# Patient Record
Sex: Female | Born: 1960 | Hispanic: Yes | Marital: Married | Smoking: Never smoker
Health system: Southern US, Community
[De-identification: ages and names within clinical notes are randomized; demographics above are authoritative.]

## PROBLEM LIST (undated history)

## (undated) DIAGNOSIS — M797 Fibromyalgia: Secondary | ICD-10-CM

## (undated) DIAGNOSIS — R5382 Chronic fatigue, unspecified: Secondary | ICD-10-CM

## (undated) DIAGNOSIS — H5213 Myopia, bilateral: Secondary | ICD-10-CM

## (undated) DIAGNOSIS — Q059 Spina bifida, unspecified: Secondary | ICD-10-CM

## (undated) DIAGNOSIS — T8859XA Other complications of anesthesia, initial encounter: Secondary | ICD-10-CM

## (undated) HISTORY — DX: Chronic fatigue, unspecified: R53.82

## (undated) HISTORY — DX: Fibromyalgia: M79.7

## (undated) HISTORY — DX: Spina bifida, unspecified: Q05.9

## (undated) HISTORY — DX: Myopia, bilateral: H52.13

## (undated) HISTORY — PX: TUBAL LIGATION: SHX77

## (undated) HISTORY — DX: Other complications of anesthesia, initial encounter: T88.59XA

---

## 2019-04-12 ENCOUNTER — Telehealth: Payer: Self-pay | Admitting: Pediatric Intensive Care

## 2019-04-12 NOTE — Telephone Encounter (Signed)
Call to client for referral from [place]. Call with [facility] Interpretation [phone]. Client ID [phone]. Client is currently living in [facility] but will be coming to [facility] next week to care for her parents. She and her husband will be living here permanently. Client states that she is post-menapausal but has been having vaginal bleeding- scant amount, no bright red- for the last ten days. She states a history of ovarian cysts. She states some lower abdominal pain on the first day she had the bleeding "like with a period". She has a history of fibromyalgia, environmental allergies and allergic rhinitis. She takes not prescription or OTC medications. Client consents to referral for PCP. She would like an appointment as soon as possible. CN advised client of process for referral and that new patient appointment might take up to 4 weeks. CN will refer to mobile clinic for walk in if necessary. Lisette Abu RN BSN CNP [phone]

## 2019-04-13 ENCOUNTER — Telehealth: Payer: Self-pay

## 2019-04-13 NOTE — Telephone Encounter (Signed)
Appointment scheduled to establish care with Dr Margarita Rana 05/02/2019 @ 1530.  She could be seen at the mobile clinic prior to that if needed.

## 2019-04-18 ENCOUNTER — Telehealth: Payer: Self-pay | Admitting: Pediatric Intensive Care

## 2019-04-18 NOTE — Telephone Encounter (Signed)
Left HIPAA compliant message with [facility] [phone]. Please return call to discuss appointment date/time. Lisette Abu RN BSN CNP [phone]

## 2019-04-19 ENCOUNTER — Telehealth: Payer: Self-pay | Admitting: Pediatric Intensive Care

## 2019-04-19 NOTE — Telephone Encounter (Signed)
Call from client with friend Minette Brine fro interpretation. Clientt states that she has filled out new patient appointment information at [facility] and will make appointment there. CN advised client to call if she needs further assistance. Lisette Abu Rn BSN CNP [phone]

## 2019-05-02 ENCOUNTER — Ambulatory Visit: Payer: Self-pay | Admitting: Family Medicine

## 2019-05-22 ENCOUNTER — Other Ambulatory Visit: Payer: Self-pay

## 2019-05-22 ENCOUNTER — Ambulatory Visit (INDEPENDENT_AMBULATORY_CARE_PROVIDER_SITE_OTHER): Payer: Self-pay | Admitting: Family Medicine

## 2019-05-22 ENCOUNTER — Encounter: Payer: Self-pay | Admitting: Family Medicine

## 2019-05-22 VITALS — BP 118/62 | HR 86 | Ht 60.0 in | Wt 111.8 lb

## 2019-05-22 DIAGNOSIS — R5382 Chronic fatigue, unspecified: Secondary | ICD-10-CM

## 2019-05-22 DIAGNOSIS — Z0000 Encounter for general adult medical examination without abnormal findings: Secondary | ICD-10-CM

## 2019-05-22 DIAGNOSIS — Z8742 Personal history of other diseases of the female genital tract: Secondary | ICD-10-CM

## 2019-05-22 DIAGNOSIS — G9332 Myalgic encephalomyelitis/chronic fatigue syndrome: Secondary | ICD-10-CM

## 2019-05-22 DIAGNOSIS — M797 Fibromyalgia: Secondary | ICD-10-CM

## 2019-05-22 DIAGNOSIS — E785 Hyperlipidemia, unspecified: Secondary | ICD-10-CM

## 2019-05-22 NOTE — Progress Notes (Signed)
    SUBJECTIVE:   CHIEF COMPLAINT / HPI:   Mrs. Merklinger is a 59 yo F that presents as a new patient with the concerns below. She has been in the [place] for 2 months. She has traveled here with her husband from [place]. She was quarantined in [place] for several months prior. She has her health records from [place] and will bring them to her next visit.   Hx of abnormal vaginal bleeding 03/28/19 had 6 days of intermittent bleeding where she required the use of pads and tampons. Has not occurred since. Has happened in the past and endometrium was biopsied. Patient reports this biopsy was normal. Denies current vaginal bleeding, discharge, or pelvic pain.  Hx of chronic fatigue/fibromyalgia Endorses multiple pains as well as headaches all the time. In [place] was  diagnosed with fibromyalgia. She is not currently taking any medication for this. She denies changes in vision.  Health maintenance Patient report normal pap in 2019 and normal mammogram in 2021. Has high cholesterol but does not take medication for this.   PERTINENT  PMH / PSH: Seasonal allergies, C/S 1995, Tubal ligation 1998  OBJECTIVE:   BP 118/62   Pulse 86   Ht 5' (1.524 m)   Wt 111 lb 12.8 oz (50.7 kg)   SpO2 99%   BMI 21.83 kg/m    General: Appears well, no acute distress. Age appropriate. Cardiac: RRR, normal heart sounds, no murmurs Respiratory: CTAB, normal effort Abdomen: soft, nontender, nondistended, +bowel sounds Extremities: No edema or cyanosis.  ASSESSMENT/PLAN:   Hx of metrorrhagia -Hgb 13.5 -TSH wnl -Transvaginal US scheduled for 5/24 @2pm  -Follow up with records -Consider GYN referral or colpo clinic at follow up visit in 1 week  Chronic fatigue syndrome with fibromyalgia -Consider gabapentin or duloxetine if patient is amenable -Discuss at follow up visit  Hyperlipidemia -LDL-c 127 -ASCVD 3.6% -Assess for risk factors at follow up   Healthcare maintenance -Bring all records to follow up -CBC,  BMP grossly unremarkable -Consider A1c w/ elevated gluoce on BMP -FHx of Heart Disease, HTN, CVA, and osteoporosis   Gerlene Fee, [place]

## 2019-05-22 NOTE — Patient Instructions (Signed)
It was very nice to meet you today. Please enjoy the rest of your week. Today you were seen to establish care.  Follow up in 1 week for an annual wellness exam or sooner if needed.  Please bring your medical records to your next visit.

## 2019-05-23 LAB — CBC WITH DIFFERENTIAL/PLATELET
Basophils Absolute: 0 10*3/uL (ref 0.0–0.2)
Basos: 0 %
EOS (ABSOLUTE): 0.1 10*3/uL (ref 0.0–0.4)
Eos: 1 %
Hematocrit: 40.6 % (ref 34.0–46.6)
Hemoglobin: 13.5 g/dL (ref 11.1–15.9)
Immature Grans (Abs): 0 10*3/uL (ref 0.0–0.1)
Immature Granulocytes: 0 %
Lymphocytes Absolute: 1.3 10*3/uL (ref 0.7–3.1)
Lymphs: 21 %
MCH: 31.5 pg (ref 26.6–33.0)
MCHC: 33.3 g/dL (ref 31.5–35.7)
MCV: 95 fL (ref 79–97)
Monocytes Absolute: 0.4 10*3/uL (ref 0.1–0.9)
Monocytes: 6 %
Neutrophils Absolute: 4.5 10*3/uL (ref 1.4–7.0)
Neutrophils: 72 %
Platelets: 366 10*3/uL (ref 150–450)
RBC: 4.28 x10E6/uL (ref 3.77–5.28)
RDW: 12.9 % (ref 11.7–15.4)
WBC: 6.2 10*3/uL (ref 3.4–10.8)

## 2019-05-23 LAB — BASIC METABOLIC PANEL
BUN/Creatinine Ratio: 16 (ref 12–28)
BUN: 13 mg/dL (ref 8–27)
CO2: 22 mmol/L (ref 20–29)
Calcium: 9.9 mg/dL (ref 8.7–10.3)
Chloride: 103 mmol/L (ref 96–106)
Creatinine, Ser: 0.83 mg/dL (ref 0.57–1.00)
GFR calc Af Amer: 88 mL/min/{1.73_m2} (ref >59)
GFR calc non Af Amer: 76 mL/min/{1.73_m2} (ref >59)
Glucose: 112 mg/dL — ABNORMAL HIGH (ref 65–99)
Potassium: 4.2 mmol/L (ref 3.5–5.2)
Sodium: 141 mmol/L (ref 134–144)

## 2019-05-23 LAB — TSH: TSH: 2.09 u[IU]/mL (ref 0.450–4.500)

## 2019-05-23 LAB — LIPID PANEL
Chol/HDL Ratio: 4.6 ratio — ABNORMAL HIGH (ref 0.0–4.4)
Cholesterol, Total: 214 mg/dL — ABNORMAL HIGH (ref 100–199)
HDL: 47 mg/dL (ref >39)
LDL Chol Calc (NIH): 132 mg/dL — ABNORMAL HIGH (ref 0–99)
Triglycerides: 197 mg/dL — ABNORMAL HIGH (ref 0–149)
VLDL Cholesterol Cal: 35 mg/dL (ref 5–40)

## 2019-05-26 ENCOUNTER — Encounter: Payer: Self-pay | Admitting: Family Medicine

## 2019-05-26 DIAGNOSIS — G9332 Myalgic encephalomyelitis/chronic fatigue syndrome: Secondary | ICD-10-CM | POA: Insufficient documentation

## 2019-05-26 DIAGNOSIS — E785 Hyperlipidemia, unspecified: Secondary | ICD-10-CM | POA: Insufficient documentation

## 2019-05-26 DIAGNOSIS — J302 Other seasonal allergic rhinitis: Secondary | ICD-10-CM | POA: Insufficient documentation

## 2019-05-26 DIAGNOSIS — Z0000 Encounter for general adult medical examination without abnormal findings: Secondary | ICD-10-CM | POA: Insufficient documentation

## 2019-05-26 DIAGNOSIS — N950 Postmenopausal bleeding: Secondary | ICD-10-CM | POA: Insufficient documentation

## 2019-05-26 NOTE — Assessment & Plan Note (Addendum)
-  Hgb 13.5 -TSH wnl -Transvaginal US scheduled for 5/24 @2pm  -Follow up with records -Consider GYN referral or colpo clinic at follow up visit in 1 week

## 2019-05-26 NOTE — Assessment & Plan Note (Addendum)
-  LDL-c 127 -ASCVD 3.6% -Assess for risk factors at follow up

## 2019-05-26 NOTE — Assessment & Plan Note (Signed)
-  Consider gabapentin or duloxetine if patient is amenable -Discuss at follow up visit

## 2019-05-26 NOTE — Assessment & Plan Note (Signed)
-  Bring all records to follow up -CBC, BMP grossly unremarkable -Consider A1c w/ elevated gluoce on BMP -FHx of Heart Disease, HTN, CVA, and osteoporosis

## 2019-05-29 ENCOUNTER — Other Ambulatory Visit: Payer: Self-pay

## 2019-05-29 ENCOUNTER — Ambulatory Visit
Admission: RE | Admit: 2019-05-29 | Discharge: 2019-05-29 | Disposition: A | Discharge status: Home / Self Care | Payer: Self-pay | Source: Ambulatory Visit | Attending: Family Medicine | Admitting: Family Medicine

## 2019-05-29 DIAGNOSIS — Z8742 Personal history of other diseases of the female genital tract: Secondary | ICD-10-CM | POA: Insufficient documentation

## 2019-05-31 ENCOUNTER — Encounter: Payer: Self-pay | Admitting: Family Medicine

## 2019-06-06 ENCOUNTER — Other Ambulatory Visit (HOSPITAL_COMMUNITY): Payer: Self-pay | Admitting: Family Medicine

## 2019-06-06 DIAGNOSIS — Z8742 Personal history of other diseases of the female genital tract: Secondary | ICD-10-CM

## 2019-06-06 DIAGNOSIS — N838 Other noninflammatory disorders of ovary, fallopian tube and broad ligament: Secondary | ICD-10-CM

## 2019-06-07 ENCOUNTER — Telehealth (HOSPITAL_COMMUNITY): Payer: Self-pay | Admitting: Family Medicine

## 2019-06-07 NOTE — Telephone Encounter (Signed)
Attempted to call pt with [facility]. Joy Woods [id number] could not LVM d/t number on file is disconnected.

## 2019-06-07 NOTE — Telephone Encounter (Signed)
-----   Message from Junious Dresser, [place] sent at 06/06/2019  2:45 PM EDT ----- Did you call the pt? ----- Message ----- From: Gerlene Fee, DO Sent: 06/06/2019   1:42 PM EDT To: [facility]  Patient has a uterine fibroid in the upper segment of uterus. This could be the source of abnormal bleeding. There is a simple cyst on the left ovary this is likely benign. Patient also has a complex heterogeneous collection at right ovary of uncertainty. Will refer to gynecology and discuss further at follow up appointment with 06/12/2019.

## 2019-06-12 ENCOUNTER — Other Ambulatory Visit: Payer: Self-pay

## 2019-06-12 ENCOUNTER — Encounter: Payer: Self-pay | Admitting: Family Medicine

## 2019-06-12 ENCOUNTER — Ambulatory Visit (INDEPENDENT_AMBULATORY_CARE_PROVIDER_SITE_OTHER): Payer: Self-pay | Admitting: Family Medicine

## 2019-06-12 VITALS — BP 112/62 | HR 84 | Wt 113.0 lb

## 2019-06-12 DIAGNOSIS — N950 Postmenopausal bleeding: Secondary | ICD-10-CM

## 2019-06-12 DIAGNOSIS — R9389 Abnormal findings on diagnostic imaging of other specified body structures: Secondary | ICD-10-CM

## 2019-06-12 DIAGNOSIS — E785 Hyperlipidemia, unspecified: Secondary | ICD-10-CM

## 2019-06-12 DIAGNOSIS — Z0000 Encounter for general adult medical examination without abnormal findings: Secondary | ICD-10-CM

## 2019-06-12 NOTE — Patient Instructions (Signed)
It was very nice to meet you today.   Please got to [facility] for your MRI.  You will be called to schedule your gynecology appointment.   Please call the clinic at [phone] if your symptoms worsen or you have any concerns. It was our pleasure to serve you.

## 2019-06-12 NOTE — Progress Notes (Signed)
    SUBJECTIVE:   CHIEF COMPLAINT / HPI:   Joy Woods is a 59 year old female who presents with her husband to discuss her concerns below.  Ultrasound results Last pelvic imaging according to patient was 11/2018 and was normal according to the patient. She is aware of the cyst on her left ovary. Voiced understanding of being referred to gynecology for uncertain right ovary finding. Is not experiencing bleeding currently and none since March 2021. Has occasional abdominal discomfort but believes this is abdominal pain.  Office visit 05/22/2019 03/28/19 had 6 days of intermittent bleeding where she required the use of pads and tampons. Has not occurred since. Has happened in the past and endometrium was biopsied. Patient reports this biopsy was normal. Denies current vaginal bleeding, discharge, or pelvic pain.  Financial assistance Required financial assistance. Over $2000 in debt from medical bills since being in the [place]. She has been in the [place] for 3 months. Called gynecology office and was instructed to pay before she is seen. Would like to speak personally with Joy Woods about her options.   PERTINENT  PMH / PSH: Hx of postmenopausal bleeding, HLD, fibromyalgia  OBJECTIVE:   BP 112/62   Pulse 84   Wt 113 lb (51.3 kg)   SpO2 98%   BMI 22.07 kg/m   General: Appears well, no acute distress. Age appropriate. Husband present.  Respiratory: Speaking in full sentences. Normal effort  US Pelvis Transvaginal 05/29/2019 Partially calcified leiomyoma at upper uterine segment 2.9 cm greatest size. Obscured endometrial complex. Large simple cyst LEFT ovary 4.8 cm diameter. Complex heterogeneous hypoechoic collection in the RIGHT adnexa, uncertain if represents a mass or potentially segment of complicated hydrosalpinx. Consider further evaluation by MR imaging to characterize.  ASSESSMENT/PLAN:   Postmenopausal bleeding Not currently bleeding or having any other symptoms. Hgb  stable. -Pelvic MRI scheduled for 06/20/2019 @8pm  -Referral to Gynecology -Financial assistance discussed with Joy Woods during this visit   Hyperlipidemia -Follow up in 6 month to repeat fasting  Healthcare maintenance Prior records to be copied today. -Follow up in 6 months or sooner if needed   Gerlene Fee, [place] present during entire encounter Ranell Patrick [phone] * >30 minutes spent discussing results and medical assistance with patient and husband.

## 2019-06-12 NOTE — Assessment & Plan Note (Signed)
Prior records to be copied today. -Follow up in 6 months or sooner if needed

## 2019-06-12 NOTE — Assessment & Plan Note (Addendum)
Not currently bleeding or having any other symptoms. Hgb stable. -Pelvic MRI scheduled for 06/20/2019 @8pm  -Referral to Gynecology -Financial assistance discussed with Kennyth Lose during this visit

## 2019-06-12 NOTE — Assessment & Plan Note (Signed)
-  Follow up in 6 month to repeat fasting

## 2019-06-20 ENCOUNTER — Ambulatory Visit (HOSPITAL_COMMUNITY)
Admission: RE | Admit: 2019-06-20 | Discharge: 2019-06-20 | Disposition: A | Discharge status: Home / Self Care | Payer: Self-pay | Source: Ambulatory Visit | Attending: Family Medicine | Admitting: Family Medicine

## 2019-06-20 DIAGNOSIS — R9389 Abnormal findings on diagnostic imaging of other specified body structures: Secondary | ICD-10-CM | POA: Insufficient documentation

## 2019-06-20 DIAGNOSIS — N950 Postmenopausal bleeding: Secondary | ICD-10-CM | POA: Insufficient documentation

## 2019-06-20 MED ORDER — GADOBUTROL 1 MMOL/ML IV SOLN
6.0000 mL | Freq: Once | INTRAVENOUS | Status: AC | PRN
  Administered 2019-06-20: 6 mL via INTRAVENOUS

## 2019-06-23 ENCOUNTER — Telehealth: Payer: Self-pay | Admitting: Family Medicine

## 2019-06-23 NOTE — Telephone Encounter (Signed)
Attempted to call patient @ [phone] with [facility] about MRI results. No answer and no option to leave a voicemail. Will attempt to reach patient by phone Monday. After than I will send a letter of results.   Dr. Janus Molder

## 2019-06-26 ENCOUNTER — Encounter: Payer: Self-pay | Admitting: Family Medicine

## 2019-06-27 ENCOUNTER — Other Ambulatory Visit: Payer: Self-pay | Admitting: Family Medicine

## 2019-06-27 NOTE — Addendum Note (Signed)
Addended by: Caralee Ates on: 06/27/2019 08:26 AM   Modules accepted: Orders

## 2019-06-27 NOTE — Progress Notes (Signed)
Reflex order placed in prior encounter for CA 125

## 2019-08-14 ENCOUNTER — Telehealth: Payer: Self-pay | Admitting: Family Medicine

## 2019-08-15 NOTE — Telephone Encounter (Signed)
Attempted to call patient at [phone] using [place] interpreter. Her gyn appt is tomorrow 08/16/2019. She will see me 08/28/2019 @3 :10PM.  Neli Fofana Autry-Lott, DO 08/15/2019, 5:44 PM PGY-2, [place]

## 2019-08-16 ENCOUNTER — Ambulatory Visit (INDEPENDENT_AMBULATORY_CARE_PROVIDER_SITE_OTHER): Payer: Self-pay | Admitting: Obstetrics & Gynecology

## 2019-08-16 ENCOUNTER — Other Ambulatory Visit: Payer: Self-pay

## 2019-08-16 ENCOUNTER — Encounter: Payer: Self-pay | Admitting: Obstetrics & Gynecology

## 2019-08-16 VITALS — BP 117/66 | HR 79 | Ht 60.0 in | Wt 113.2 lb

## 2019-08-16 DIAGNOSIS — D250 Submucous leiomyoma of uterus: Secondary | ICD-10-CM

## 2019-08-16 DIAGNOSIS — N950 Postmenopausal bleeding: Secondary | ICD-10-CM

## 2019-08-16 DIAGNOSIS — N83209 Unspecified ovarian cyst, unspecified side: Secondary | ICD-10-CM

## 2019-08-16 NOTE — Progress Notes (Signed)
Patient ID: Joy Woods, female   DOB: 17-Jun-1960, 59 y.o.   MRN: [id number]  Chief Complaint  Patient presents with   Gynecologic Exam    HPI Leahann Climmie Cronce is a 59 y.o. female.  G2P0 No LMP recorded. Patient is postmenopausal. She had an episode of vaginal bleeding in March that lasted for less than a week, no bleeding since then. A similar episode 3 years ago in [place] was evaluated and endometrial biopsy was negative. I reviewed the [place] and MRI report and images today.  HPI   Past Medical History:  Diagnosis Date   Chronic fatigue    Complication of anesthesia    Fibromyalgia    Myopia of both eyes    Spina bifida ([place])     Past Surgical History:  Procedure Laterality Date   CESAREAN SECTION     TUBAL LIGATION      Family History  Problem Relation Age of Onset   Heart disease Mother    High blood pressure Mother    High blood pressure Father    Osteoporosis Father    Stroke Father    Heart disease Brother     Social History Social History   Tobacco Use   Smoking status: Never Smoker   Smokeless tobacco: Never Used  Substance Use Topics   Alcohol use: Not on file   Drug use: Not on file    Allergies  Allergen Reactions   Ibuprofen     Makes headache worse    Current Outpatient Medications  Medication Sig Dispense Refill   ibuprofen (ADVIL) 100 MG/5ML suspension Take 200 mg by mouth every 4 (four) hours as needed.     No current facility-administered medications for this visit.    Review of Systems Review of Systems  Constitutional: Negative.   Gastrointestinal: Positive for abdominal pain (occasional ). Negative for anal bleeding, constipation and diarrhea.  Genitourinary: Negative for pelvic pain, vaginal bleeding and vaginal discharge.    Blood pressure 117/66, pulse 79, height 5' (1.524 m), weight 113 lb 3.2 oz (51.3 kg).  Physical Exam Physical Exam Vitals and nursing note reviewed.   Constitutional:      Appearance: Normal appearance. She is not ill-appearing.  HENT:     Head: Normocephalic.  Cardiovascular:     Rate and Rhythm: Normal rate.  Pulmonary:     Effort: Pulmonary effort is normal.  Abdominal:     General: There is no distension.  Musculoskeletal:        General: Normal range of motion.  Neurological:     Mental Status: She is alert.  Psychiatric:        Mood and Affect: Mood normal.        Behavior: Behavior normal.     Data Reviewed Narrative & Impression  CLINICAL DATA:  Abnormal uterine bleeding, postmenopausal bleeding, history of Caesarean section and tubal ligation  EXAM: ULTRASOUND PELVIS TRANSVAGINAL  TECHNIQUE: Transvaginal ultrasound examination of the pelvis was performed including evaluation of the uterus, ovaries, adnexal regions, and pelvic cul-de-sac.  COMPARISON:  None  FINDINGS: Uterus  Measurements: 5.8 x 3.3 x 4.2 cm = volume: 41 mL. Heterogeneous myometrium. Anteverted. Partially calcified leiomyoma at posterior upper uterus 2.3 x 2.0 x 2.9 cm. This extends submucosal and displaces endometrium anteriorly.  Endometrium  Obscured secondary to leiomyoma at posterior upper uterus, unable to adequately delineated for measurement  Right ovary  Measurements: 2.4 x 1.2 x 1.4 cm = volume: 2.2 mL. Normal morphology without  mass  Left ovary  Measurements: 5.9 x 3.3 x 1.9 cm = volume: 19.7 mL. Simple cyst LEFT ovary 4.8 x 3.2 x 3.7 cm.  Other findings: Complicated hypoechoic collection identified in the RIGHT adnexa, 5.8 x 2.5 x 1.7 cm, question complicated hydrosalpinx versus irregular cystic mass. No free pelvic fluid.  IMPRESSION: Partially calcified leiomyoma at upper uterine segment 2.9 cm greatest size.  Obscured endometrial complex.  Large simple cyst LEFT ovary 4.8 cm diameter.  Complex heterogeneous hypoechoic collection in the RIGHT adnexa, uncertain if represents a mass or  potentially segment of complicated hydrosalpinx.  Consider further evaluation by MR imaging to characterize.   Electronically Signed   By: Lavonia Dana M.D.   On: 05/29/2019 17:08   CLINICAL DATA:  Right adnexal mass and left ovarian cyst on recent ultrasound. Postmenopausal female.  EXAM: MRI PELVIS WITHOUT AND WITH CONTRAST  TECHNIQUE: Multiplanar multisequence MR imaging of the pelvis was performed both before and after administration of intravenous contrast.  CONTRAST:  99mL GADAVIST GADOBUTROL 1 MMOL/ML IV SOLN  COMPARISON:  Ultrasound on 05/29/2019  FINDINGS: Lower Urinary Tract: No urinary bladder or urethral abnormality identified.  Bowel: Sigmoid diverticulosis is demonstrated, however there is no evidence of diverticulitis.  Vascular/Lymphatic: Unremarkable. No pathologically enlarged pelvic lymph nodes identified.  Reproductive:  -- Uterus: Measures 5.7 x 3.3 x 4.6 cm. A central submucosal fibroid is seen which measures 2.5 cm in maximum diameter. This obscures the endometrium, however there is no evidence of abnormal endometrial thickening. Cervix and vagina are unremarkable.  -- Right ovary: Appears normal. A small cystic tubular structure is seen adjacent to the right ovary which measures 3.2 x 0.9 cm, consistent with a mild hydrosalpinx.  -- Left ovary: A simple cyst with benign characteristics is seen measuring 4.9 x 3.7 cm. This has benign characteristics.  Other: No peritoneal thickening or abnormal free fluid.  Musculoskeletal:  Unremarkable.  IMPRESSION: 1. Mild right hydrosalpinx.  No evidence of right ovarian mass. 2. 4.9 cm benign-appearing left ovarian cyst. Suggest correlation with tumor markers, and recommend continued follow-up by ultrasound in 6-12 months. 3. 2.5 cm central submucosal fibroid. No evidence of abnormal endometrial thickening. 4. Sigmoid diverticulosis, without evidence of  diverticulitis.   Electronically Signed   By: Marlaine Hind M.D.   On: 06/21/2019 10:29    Assessment Postmenopausal bleeding  Submucous leiomyoma of uterus - Plan: CA 125  Cyst of ovary, unspecified laterality - Plan: US PELVIC COMPLETE WITH TRANSVAGINAL    Plan Repeat imaging in 4 months for a 6 month interval for the cyst.She has had a benign endometrial biopsy. We discussed this and I feel a repeat can be deferred and she agreed to this although scheduling a biopsy was offered. CA 125 will be done today although the cyst had benign features and could resolve    Emeterio Reeve 08/16/2019, 2:36 PM

## 2019-08-16 NOTE — Patient Instructions (Signed)
Quiste ovrico Ovarian Cyst Un quiste ovrico es una bolsa llena de lquido que se forma en el ovario. Los ovarios son rganos de las mujeres que producen vulos. La mayora de los quistes ovricos desaparecen por s solos y no son cancerosos (son benignos). Otros quistes necesitan tratamiento. Siga estas indicaciones en su casa:  Tome los medicamentos de venta libre y los recetados solamente como se lo haya indicado el mdico.  No conduzca ni use maquinaria pesada mientras toma analgsicos recetados.  Realcese exmenes plvicos y pruebas de Papanicolaou con la frecuencia que le haya indicado el mdico.  Retome sus actividades habituales como se lo haya indicado el mdico. Pregntele al mdico qu actividades son seguras para usted.  No consuma ningn producto que contenga nicotina o tabaco, como cigarrillos y Psychologist, sport and exercise. Si necesita ayuda para dejar de fumar, consulte al mdico.  Concurra a todas las visitas de control como se lo haya indicado el mdico. Esto es importante. Comunquese con un mdico si:  Los perodos menstruales: ? Se retrasan. ? Son irregulares. ? Son dolorosos.   Sus perodos cesan.  Tiene dolor plvico que no desaparece.  Siente presin en la vejiga.  Tiene dificultad para vaciar la vejiga cuando orina.  Siente dolor durante las [employer].  Le aparece alguno de los siguientes sntomas en el abdomen: ? Sensacin de Environmental health practitioner lleno. ? Presin. ? Molestias. ? Dolor que persiste. ? Hinchazn.  Se siente mal constantemente.  Tiene dificultades para defecar (estreimiento).  No tiene el apetito habitual (pierde el apetito).  Tiene un acn muy grave.  Nota un aumento del vello corporal y facial.  Aumenta o disminuye de peso sin hacer modificaciones en su actividad fsica y en su dieta habitual.  Cree que puede estar [place]. Solicite ayuda de inmediato si:  Siente en el abdomen un dolor muy intenso o que  [place].  No puede comer ni beber sin vomitar.  Sbitamente tiene fiebre.  Los perodos menstruales son ms abundantes de lo habitual. Esta informacin no tiene Marine scientist el consejo del mdico. Asegrese de hacerle al mdico cualquier pregunta que tenga. Document Revised: 03/28/2016 Document Reviewed: 05/26/2015 Elsevier Patient Education  2020 [employer].

## 2019-08-17 LAB — CA 125: Cancer Antigen (CA) 125: 11.1 U/mL (ref 0.0–38.1)

## 2019-08-28 ENCOUNTER — Encounter: Payer: Self-pay | Admitting: Family Medicine

## 2019-08-28 ENCOUNTER — Other Ambulatory Visit: Payer: Self-pay

## 2019-08-28 ENCOUNTER — Ambulatory Visit (INDEPENDENT_AMBULATORY_CARE_PROVIDER_SITE_OTHER): Payer: Self-pay | Admitting: Family Medicine

## 2019-08-28 VITALS — BP 92/64 | HR 86 | Ht 60.0 in | Wt 113.4 lb

## 2019-08-28 DIAGNOSIS — G8929 Other chronic pain: Secondary | ICD-10-CM

## 2019-08-28 DIAGNOSIS — Z0000 Encounter for general adult medical examination without abnormal findings: Secondary | ICD-10-CM

## 2019-08-28 DIAGNOSIS — M25561 Pain in right knee: Secondary | ICD-10-CM

## 2019-08-28 DIAGNOSIS — Z598 Other problems related to housing and economic circumstances: Secondary | ICD-10-CM

## 2019-08-28 DIAGNOSIS — Z5989 Other problems related to housing and economic circumstances: Secondary | ICD-10-CM

## 2019-08-28 DIAGNOSIS — N950 Postmenopausal bleeding: Secondary | ICD-10-CM

## 2019-08-28 NOTE — Progress Notes (Signed)
SUBJECTIVE:   CHIEF COMPLAINT / HPI:   Right knee pain Experiencing right knee discomfort for 1-2 years intermittently. It is localized above and around the knee. Walking makes it feel better. She noticed increased pain with holding her grandchild a lot. She has not tried any medication for this as she does not prefer to put "chemicals" in her body. She has tried exercise in the past from her last PCP in [place] but this did not help. She tried water aerobics but could not continue due to skin irritation from chlorine. She also joined a fibromyalgia group and did physical therapy with some improvement.   Healthcare maintenance HLD-Last Lipid panel 05/22/2019 Pap- (2019 or 2020) Mammogram-(02/2019) Colonoscopy (states she did a blood test) Tdap (declined at this time) COVID vaccine (declined at this time)  Lack of health insurance Moved from [place] this year and has accumulated several medical bills. Attempted to get assistance with applying for orange card/insurance at our last visit in May. They spoke with Leory Plowman but have not been able to get in touch with her.   PERTINENT  PMH / PSH: Hx of Post Menopausal Bleeding (Gyn appt. 08/16/19), Hx of fibromyalgia (prefers no medications)  OBJECTIVE:   BP 92/64   Pulse 86   Ht 5' (1.524 m)   Wt 113 lb 6.4 oz (51.4 kg)   BMI 22.15 kg/m   General: Appears well, no acute distress. Age appropriate. Cardiac: RRR, normal heart sounds, no murmurs Respiratory: CTAB, normal effort Extremities: Right knee is w/o bruising, edema, or tenderness to palpation. Full ROM in flexion and extension. Mild crepitus w/ flexion. Poor vastus muscle tone. Negative varus/valgus stress. Negative ant/post drawer test.   MRI PELVIS WITHOUT AND WITH CONTRAST  TECHNIQUE: Multiplanar multisequence MR imaging of the pelvis was performed both before and after administration of intravenous contrast.  CONTRAST:  30mL GADAVIST GADOBUTROL 1 MMOL/ML IV  SOLN  COMPARISON:  Ultrasound on 05/29/2019  FINDINGS: Lower Urinary Tract: No urinary bladder or urethral abnormality identified.  Bowel: Sigmoid diverticulosis is demonstrated, however there is no evidence of diverticulitis.  Vascular/Lymphatic: Unremarkable. No pathologically enlarged pelvic lymph nodes identified.  Reproductive:  -- Uterus: Measures 5.7 x 3.3 x 4.6 cm. A central submucosal fibroid is seen which measures 2.5 cm in maximum diameter. This obscures the endometrium, however there is no evidence of abnormal endometrial thickening. Cervix and vagina are unremarkable.  -- Right ovary: Appears normal. A small cystic tubular structure is seen adjacent to the right ovary which measures 3.2 x 0.9 cm, consistent with a mild hydrosalpinx.  -- Left ovary: A simple cyst with benign characteristics is seen measuring 4.9 x 3.7 cm. This has benign characteristics.  Other: No peritoneal thickening or abnormal free fluid.  Musculoskeletal:  Unremarkable.  IMPRESSION: 1. Mild right hydrosalpinx.  No evidence of right ovarian mass. 2. 4.9 cm benign-appearing left ovarian cyst. Suggest correlation with tumor markers, and recommend continued follow-up by ultrasound in 6-12 months. 3. 2.5 cm central submucosal fibroid. No evidence of abnormal endometrial thickening. 4. Sigmoid diverticulosis, without evidence of diverticulitis.   Electronically Signed   By: Marlaine Hind M.D.   On: 06/21/2019 10:29  Results for Joy Woods, Joy Woods DE (MRN [id number]) as of 08/29/2019 19:19   Ref. Range 08/16/2019 14:41  Cancer Antigen (CA) 125 Latest Ref Range: 0.0 - 38.1 U/mL 11.1   ASSESSMENT/PLAN:   Chronic pain of right knee Chronic for 1-2 years. Intermittent. Hx of fibromyalgia. Worsened with added weight such  as small infant. Improved with movement such as walking. No acute findings on physical exams. No evidence or knowledge of trauma. Likely combination of  degenerative joint changes such as osteoarthritis and weak vastus muscles. -PT referral -At home SLR exercises discussed -Voltaren gel/NSAIDs offered patient declined at this time -Seek fibromyalgia group -Imaging not necessary at this time but could consider in the future -Follow up as needed  Healthcare maintenance -Obtain records from [place] to have interpreted for chart with the help of [place] -ASCVD risk 3.6% no statin medication needed at this time. -Declined COVID vaccine today; patient positive in 05/2019 (spent >10 mins discussing benefits of receiving post infection)  Does not have health insurance -CCM referral for resources and financial assistance with medical bills (patient aware and agreeable)  Postmenopausal bleeding Resolved. Recent  Gyn appt 08/16/19 note personal reviewed by me and discussed with patient. Discussed imaging and blood work above as well as the plan with patient. Begnin ovarian cyst. Normal CA 125.  -Needs repeat pelvic ultrasound in 4 month for a 6 month follow up   [place]

## 2019-08-28 NOTE — Patient Instructions (Signed)
It was very nice to see you today. Please enjoy the rest of your week. Today you were seen for right knee pain for this we will refer you to physical therapy please let us know if you do not hear from them in a week. I have prescribed straight leg raise exercises to strengthen the knee, you can use a weight on the end of your ankle for an extra workout and I would do this several times a day.  Please bring in your records to be copied this week so that we may update your health maintenance tabs.  For the ovarian cysts we will repeat imaging in 4 months.  I have also referred you to our chronic care management team for insurance and financial resources they should give you a call within a week.  If you do not hear from please let us know.  Follow up as needed.   Please call the clinic at [phone] if you have any concerns. It was our pleasure to serve you.   Sndrome de Film/video editor y fibromialgia Myofascial Pain Syndrome and Fibromyalgia El sndrome de dolor miofascial y la fibromialgia son trastornos del Social research officer, government. Este dolor puede sentirse, principalmente, en los msculos.  Sndrome de dolor miofascial: ? Siempre se presenta con puntos sensibles al tacto en el msculo que causan dolor ante la compresin (puntos neurlgicos). El dolor puede aparecer y Armed forces operational officer. ? Generalmente, afecta el cuello, la parte superior de la espalda y las zonas de los hombros. El dolor suele irradiarse a los brazos y [place].  Fibromialgia: ? Cursa con dolores musculares y dolor a la palpacin que aparecen y desaparecen. ? Suele asociarse con la fatiga y los problemas del sueo. ? Cursa con puntos neurlgicos. ? Suele ser de larga duracin (crnica), pero no es potencialmente mortal. La fibromialgia y el sndrome de dolor miofascial no son lo mismo. Sin embargo, suelen presentarse juntos. Si tiene ambas afecciones, pueden intensificarse entre s. Ambas afecciones son frecuentes y pueden causar bastante  dolor y [place]. Ambas afecciones pueden ser difciles de diagnosticar porque tienen sntomas que son frecuentes en muchas otras afecciones. Cules son las causas? Se desconocen las causas exactas de estas afecciones. Qu incrementa el riesgo? Es ms probable que usted sufra esta afeccin si:  Tienen antecedentes familiares de la afeccin.  Tiene ciertos factores desencadenantes, por ejemplo: ? Dolores de [place]. ? Una lesin (traumatismo) u otros factores estresantes fsicos. ? Estar bajo mucho estrs. ? Afecciones mdicas como artrosis, artritis reumatoide o lupus. Cules son los signos o los sntomas? Fibromialgia El sntoma principal de la fibromialgia es el dolor ampliamente distribuido y la sensibilidad al tacto en los msculos. A veces, el dolor se describe como punzante, fulgurante o urente. Es posible que tambin tenga:  Hormigueo o adormecimiento.  Problemas al dormir y Programmer, applications.  Problemas con la atencin y la concentracin (disfuncin cognitiva). Otros sntomas pueden incluir lo siguiente:  Problemas de intestino y vejiga.  Dolores de [place].  Problemas visuales.  Problemas con los aromas y los ruidos.  Depresin o cambios en el estado de nimo.  Perodos menstruales dolorosos (dismenorrea).  Sequedad de la piel o los ojos. Estos sntomas pueden variar con [employer]. Sndrome de [employer] Los sntomas del sndrome de dolor miofascial incluyen lo siguiente:  Bandas musculares tensas y fibrosas.  Sensaciones de incomodidad en las zonas musculares. Estas pueden incluir dolor, calambres, ardor, adormecimiento, hormigueo y debilidad.  Dificultad para mover ciertas partes del  cuerpo libremente (poca amplitud de movimiento). Cmo se diagnostica? Esta afeccin puede diagnosticarse en funcin de los sntomas y los antecedentes mdicos. Adems, se le Sales promotion account executive fsico. En general:  La fibromialgia  se diagnostica cuando la persona tiene Social research officer, government, fatiga y otros sntomas durante ms de tres meses y esos sntomas no se pueden atribuir a Pharmacist, hospital.  El sndrome de dolor miofascial se diagnostica cuando la persona tiene puntos neurlgicos en los msculos que son sensibles al tacto y causan dolor en otra parte del cuerpo (dolor referido). Cmo se trata? El tratamiento para estas afecciones depende del tipo de afeccin que tenga.  Para la fibromialgia: ? Analgsicos, como los antiinflamatorios no esteroideos (AINE). ? Medicamentos para tratar la depresin. ? Medicamentos para tratar las convulsiones. ? Medicamentos que [employer].  Para el sndrome de dolor miofascial: ? Analgsicos, como los antiinflamatorios no esteroideos (AINE). ? Relajacin y elongacin de los msculos. ? Inyecciones en los puntos neurlgicos. ? Tratamientos con ondas de sonido (ultrasonido) para [employer]. El tratamiento de estas afecciones a menudo requiere la participacin de un equipo de mdicos. Estos pueden incluir lo siguiente:  El mdico de Marketing executive.  Un fisioterapeuta.  Mdicos complementarios, como masoterapeutas o acupunturistas.  Un psiquiatra para la terapia cognitivo conductual. Siga estas indicaciones en su casa: Medicamentos  Tome los medicamentos de venta libre y los recetados solamente como se lo haya indicado el mdico.  No conduzca ni use maquinaria pesada mientras toma analgsicos recetados.  Si toma analgsicos recetados, tome medidas para prevenir o tratar el estreimiento. El mdico puede recomendarle que: ? Beba suficiente lquido como para mantener la orina de color amarillo plido. ? Consuma alimentos ricos en fibra, como frutas y verduras frescas, cereales integrales y frijoles. ? Limite el consumo de alimentos ricos en grasa y azcares procesados, como los alimentos fritos o dulces. ? Tome un medicamento recetado o de venta libre para el  estreimiento. Estilo de vida   Haga ejercicio como se lo haya indicado el fisioterapeuta o su mdico.  Practique tcnicas de relajacin para mantener el estrs bajo control. Tal vez deba intentar lo siguiente: ? Biorretroalimentacin. ? Formacin de imgenes visuales. ? Hipnosis. ? Relajacin muscular. ? Yoga. ? Meditacin.  Lleve un estilo de vida saludable. Esto incluye consumir una dieta saludable y dormir lo suficiente.  No consuma ningn producto que contenga nicotina o tabaco, como cigarrillos y Psychologist, sport and exercise. Si necesita ayuda para dejar de fumar, consulte al mdico. Indicaciones generales  Hable con el mdico sobre los tratamientos complementarios, como acupuntura o [place].  Considere la posibilidad de unirse a un grupo de apoyo para personas a quienes les diagnosticaron esta afeccin.  No realice actividades que le generen tensin o sobrecarga muscular. Esto incluye hacer movimientos repetitivos y Lexicographer objetos pesados.  Concurra a todas las visitas de seguimiento como se lo haya indicado el mdico. Esto es importante. Dnde buscar ms informacin  Asociacin Nacional de Fibromialgia (National Fibromyalgia Association): www.fmaware.org  Fundacin contra la Artritis (Arthritis Foundation): www.arthritis.org  Asociacin Estadounidense del Dolor Crnico (American Chronic Pain Association): www.theacpa.org Comunquese con un mdico si:  Aparecen nuevos sntomas.  Los sntomas empeoran o el dolor es intenso.  Los [employer] causan efectos secundarios.  Tiene dificultad para dormir.  La afeccin le causa depresin o ansiedad. Resumen  El sndrome de dolor miofascial y la fibromialgia son trastornos del Social research officer, government.  El sndrome de dolor miofascial se presenta con puntos sensibles al tacto en el msculo que causan  dolor ante la compresin (puntos neurlgicos). La fibromialgia tambin se presenta con dolores musculares y sensibilidad al tacto que  aparecen y desaparecen, pero esta afeccin generalmente se asocia a la fatiga y alteraciones del sueo.  La fibromialgia y el sndrome de dolor miofascial no son lo mismo, pero suelen presentarse juntos, lo cual Visual merchandiser y fatiga que dificultan las actividades cotidianas.  El tratamiento para la fibromialgia incluye tomar medicamentos para Media planner los msculos y medicamentos para el dolor, la depresin o las convulsiones. El tratamiento para el sndrome de dolor miofascial incluye tomar medicamentos para Conservation officer, historic buildings, enfriar y Neurosurgeon los msculos e inyectar medicamentos en los puntos neurlgicos.  Siga las indicaciones del mdico en cuanto a tomar medicamentos y Theatre manager un estilo de vida saludable. Esta informacin no tiene Marine scientist el consejo del mdico. Asegrese de hacerle al mdico cualquier pregunta que tenga. Document Revised: 03/05/2017 Document Reviewed: 03/05/2017 Elsevier Patient Education  [place].

## 2019-08-29 DIAGNOSIS — Z5989 Other problems related to housing and economic circumstances: Secondary | ICD-10-CM | POA: Insufficient documentation

## 2019-08-29 DIAGNOSIS — M25561 Pain in right knee: Secondary | ICD-10-CM | POA: Insufficient documentation

## 2019-08-29 NOTE — Assessment & Plan Note (Signed)
-  CCM referral for resources and financial assistance with medical bills (patient aware and agreeable)

## 2019-08-29 NOTE — Assessment & Plan Note (Signed)
Chronic for 1-2 years. Intermittent. Hx of fibromyalgia. Worsened with added weight such as small infant. Improved with movement such as walking. No acute findings on physical exams. No evidence or knowledge of trauma. Likely combination of degenerative joint changes such as osteoarthritis and weak vastus muscles. -PT referral -At home SLR exercises discussed -Voltaren gel/NSAIDs offered patient declined at this time -Seek fibromyalgia group -Imaging not necessary at this time but could consider in the future -Follow up as needed

## 2019-08-29 NOTE — Assessment & Plan Note (Signed)
Resolved. Recent  Gyn appt 08/16/19 note personal reviewed by me and discussed with patient. Discussed imaging and blood work above as well as the plan with patient. Begnin ovarian cyst. Normal CA 125.  -Needs repeat pelvic ultrasound in 4 month for a 6 month follow up

## 2019-08-29 NOTE — Assessment & Plan Note (Addendum)
-  Obtain records from [place] to have interpreted for chart with the help of [place] -ASCVD risk 3.6% no statin medication needed at this time. -Declined COVID vaccine today; patient positive in 05/2019 (spent >10 mins discussing benefits of receiving post infection)

## 2019-09-06 ENCOUNTER — Telehealth: Payer: Self-pay | Admitting: Family Medicine

## 2019-09-06 NOTE — Telephone Encounter (Signed)
Medical records dropped off for dr at front desk for review. Copies placed in doctor's box.  Creig Hines

## 2019-09-18 ENCOUNTER — Telehealth: Payer: Self-pay | Admitting: Family Medicine

## 2019-09-18 NOTE — Telephone Encounter (Signed)
Joy Woods 09/18/2019 Called pt regarding community resource referral received. Left message for pt to call me back, my info is [phone] please see ref notes for more details.  Joy Woods Care Guide, Embedded Care Coordination [place], Care Management    

## 2019-09-19 ENCOUNTER — Telehealth: Payer: Self-pay | Admitting: Family Medicine

## 2019-09-19 NOTE — Telephone Encounter (Signed)
Joy Woods 09/19/2019 Called pt regarding community resource referral received. Left message for pt to call me back, my info is [phone] please see ref notes for more details.  Joy Woods Care Guide, Embedded Care Coordination [place], Care Management    

## 2019-09-21 ENCOUNTER — Telehealth: Payer: Self-pay | Admitting: Family Medicine

## 2019-09-21 NOTE — Telephone Encounter (Signed)
Joy Woods 09/21/2019 Called pt regarding community resource referral received. Left message for pt to call me back, my info is [phone] please see ref notes for more details.  [place], Care Management

## 2019-12-07 ENCOUNTER — Ambulatory Visit: Admission: RE | Admit: 2019-12-07 | Payer: Self-pay | Source: Ambulatory Visit

## 2020-11-22 IMAGING — MR MR PELVIS WO/W CM
11 of 13 series · 35 of 48 positions shown · IV contrast (gadavist)
Comparison: Ultrasound on 05/29/2019

CLINICAL DATA: Right adnexal mass and left ovarian cyst on recent
ultrasound. Postmenopausal female.

EXAM:
MRI PELVIS WITHOUT AND WITH CONTRAST
TECHNIQUE: Multiplanar multisequence MR imaging of the pelvis was performed
both before and after administration of intravenous contrast.
CONTRAST:  6mL GADAVIST GADOBUTROL 1 MMOL/ML IV SOLN

[Series 2: T2 · coronal · 4.0mm · 1.56mm/px · 1 of 32 slices shown (1 of 4)]
[im 1/32]
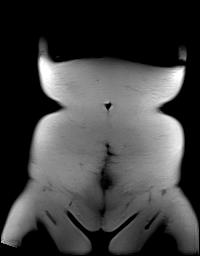

[Series 3: T2 · coronal · 4.0mm · 0.98mm/px · 2 of 32 slices shown (2 of 4)]
[im 1/32]
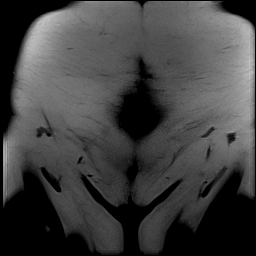
[im 32/32]
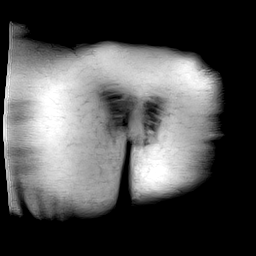

[Series 4: T2 · axial · 5.0mm · 0.49mm/px · z∈[-132,+66]mm · 3 of 34 slices shown (3 of 4)]
[im 1/34]
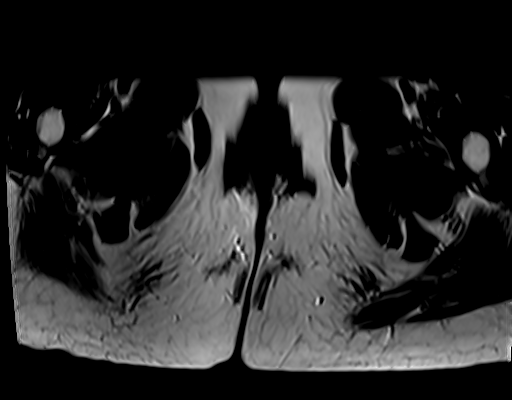
[im 17/34]
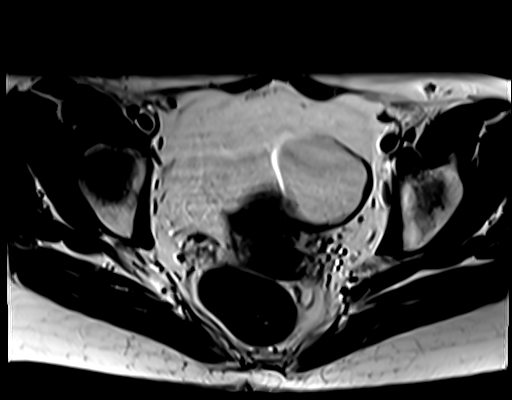
[im 34/34]
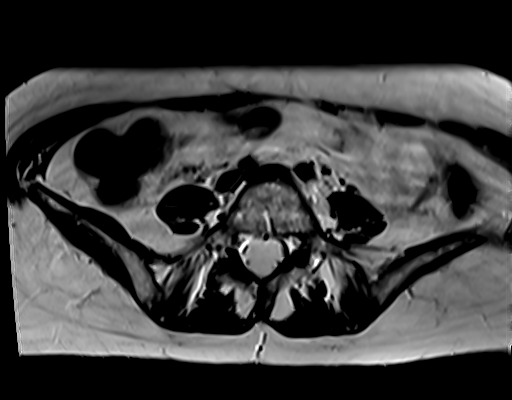

[Series 5: T2 fat-sat · axial · 5.0mm · 0.49mm/px · z∈[-132,+66]mm · 3 of 34 slices shown]
[im 1/34]
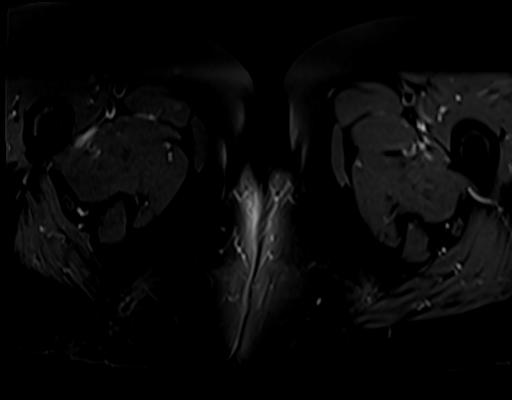
[im 17/34]
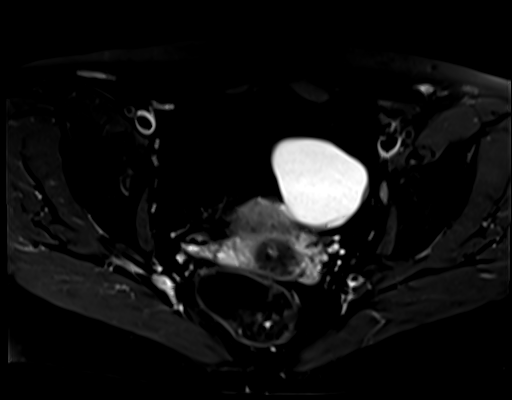
[im 34/34]
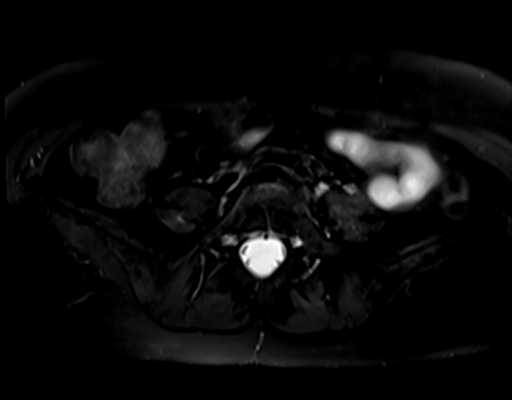

[Series 6: T2 · sagittal · 5.0mm · 0.49mm/px · 2 of 30 slices shown (4 of 4)]
[im 1/30]
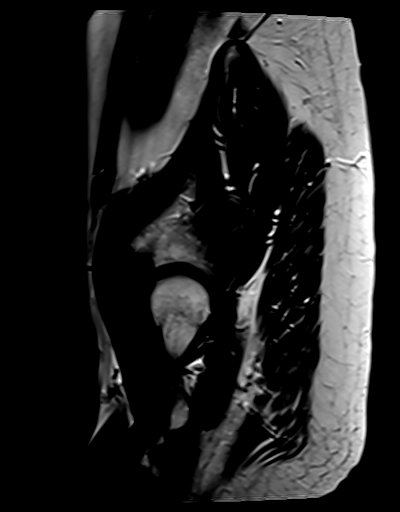
[im 30/30]
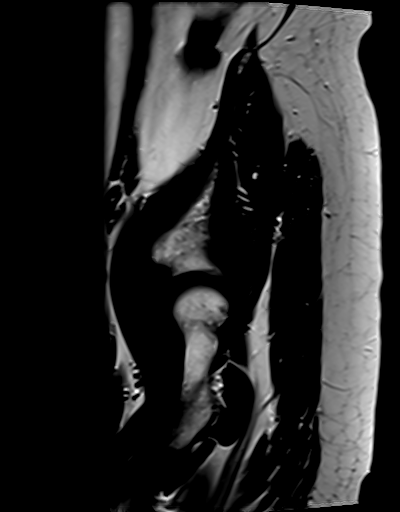

[Series 7: T1 · axial · 5.0mm · 0.49mm/px · z∈[-132,+66]mm · 3 of 34 slices shown]
[im 1/34]
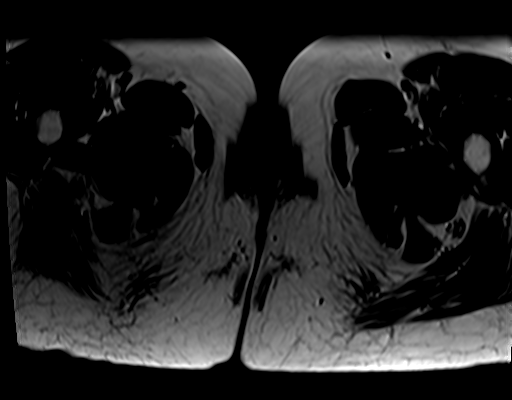
[im 17/34]
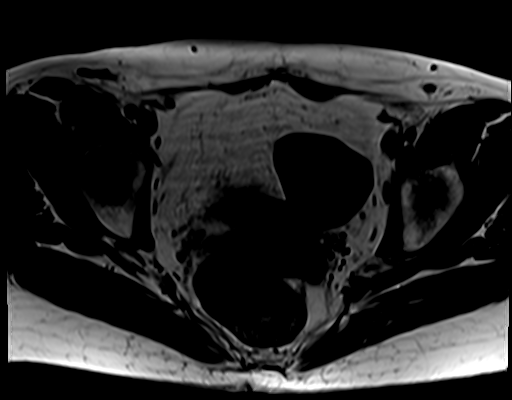
[im 34/34]
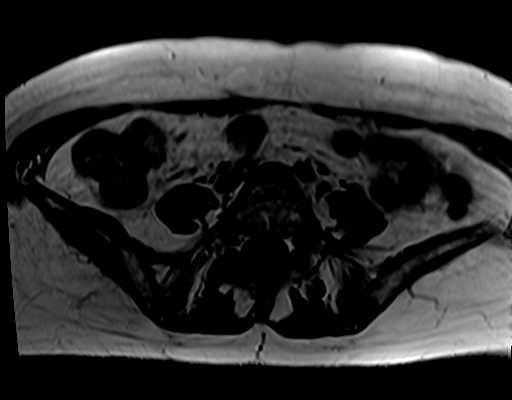

[Series 9: T1 dynamic · axial · 3.0mm · 0.84mm/px · z∈[-149,+88]mm · 6 of 80 slices shown (1 of 3)]
[im 1/80]
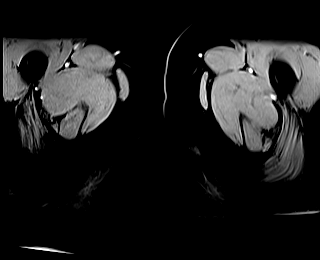
[im 16/80]
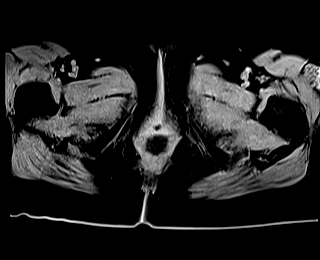
[im 32/80]
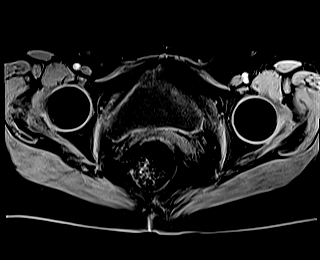
[im 48/80]
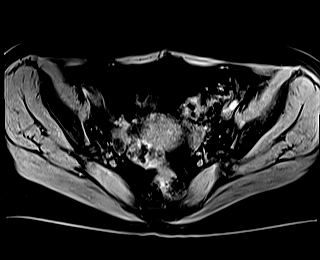
[im 64/80]
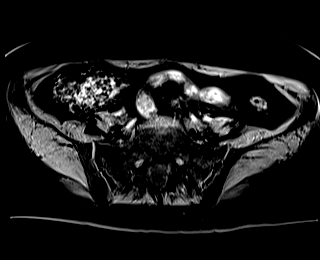
[im 80/80]
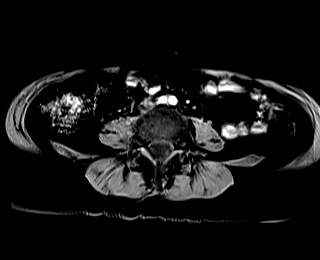

[Series 11: T1 dynamic · axial · 3.0mm · 0.84mm/px · z∈[-149,+88]mm · 6 of 80 slices shown (2 of 3)]
[im 1/80]
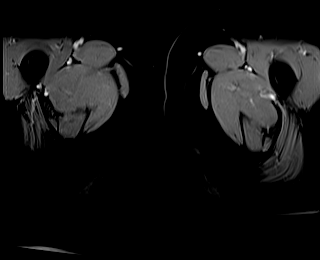
[im 16/80]
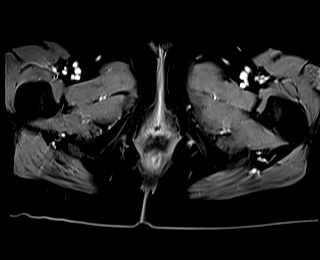
[im 32/80]
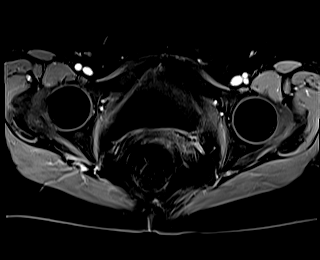
[im 48/80]
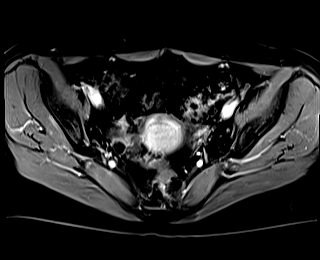
[im 64/80]
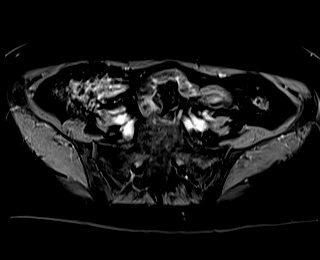
[im 80/80]
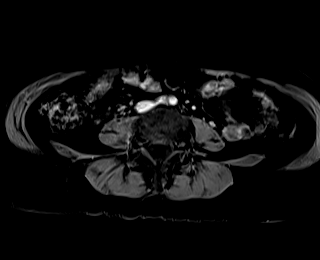

[Series 13: T1 dynamic · axial · 3.0mm · 0.84mm/px · z∈[-149,+88]mm · 6 of 80 slices shown (3 of 3)]
[im 1/80]
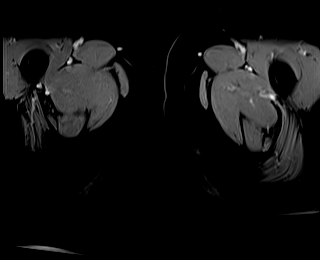
[im 16/80]
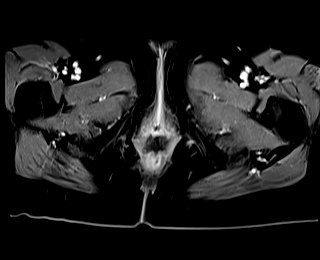
[im 32/80]
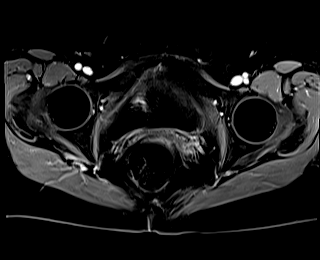
[im 48/80]
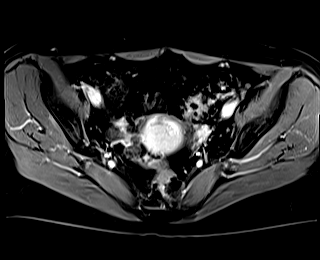
[im 64/80]
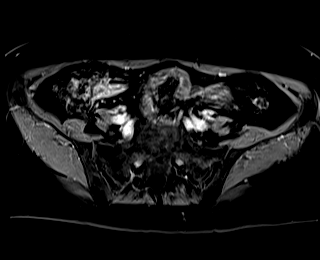
[im 80/80]
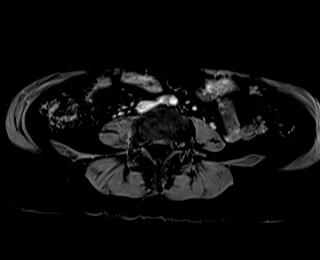

[Series 14: T1 fat-sat post-contrast · coronal · 5.0mm · 0.49mm/px · 2 of 25 slices shown]
[im 1/25]
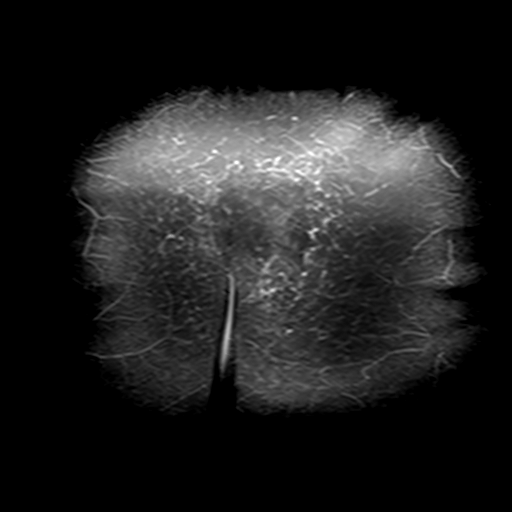
[im 25/25]
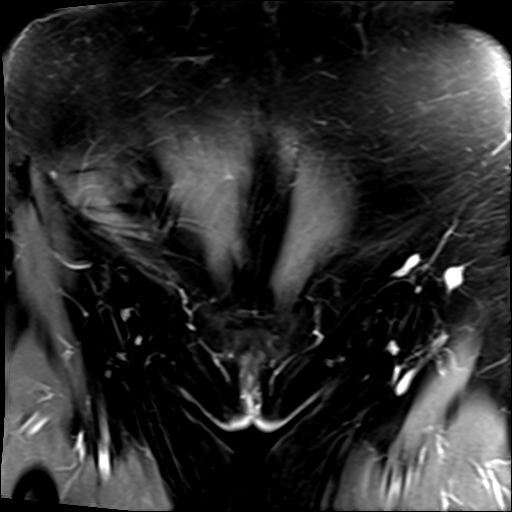

[Series 15: T2 post-contrast · sagittal · 5.0mm · 0.49mm/px · 1 of 30 slices shown]
[im 1/30]
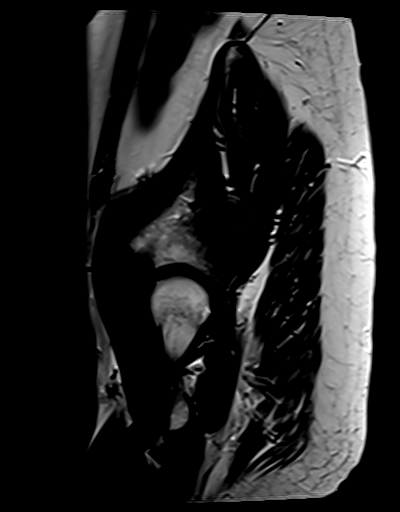

[35 of 48 positions shown; findings below may reference images not displayed]

FINDINGS: Lower Urinary Tract: No urinary bladder or urethral abnormality
identified.

Bowel: Sigmoid diverticulosis is demonstrated, however there is no
evidence of diverticulitis.

Vascular/Lymphatic: Unremarkable. No pathologically enlarged pelvic
lymph nodes identified.

Reproductive:

-- Uterus: Measures 5.7 x 3.3 x 4.6 cm. A central submucosal fibroid
is seen which measures 2.5 cm in maximum diameter. This obscures the
endometrium, however there is no evidence of abnormal endometrial
thickening. Cervix and vagina are unremarkable.

-- Right ovary: Appears normal. A small cystic tubular structure is
seen adjacent to the right ovary which measures 3.2 x 0.9 cm,
consistent with a mild hydrosalpinx.

-- Left ovary: A simple cyst with benign characteristics is seen
measuring 4.9 x 3.7 cm. This has benign characteristics.

Other: No peritoneal thickening or abnormal free fluid.

Musculoskeletal:  Unremarkable.
IMPRESSION: 1. Mild right hydrosalpinx.  No evidence of right ovarian mass.
2. 4.9 cm benign-appearing left ovarian cyst. Suggest correlation
with tumor markers, and recommend continued follow-up by ultrasound
in 6-12 months.
3. 2.5 cm central submucosal fibroid. No evidence of abnormal
endometrial thickening.
4. Sigmoid diverticulosis, without evidence of diverticulitis.
